# Patient Record
Sex: Male | Born: 1965 | Race: White | Hispanic: No | Marital: Married | Smoking: Never smoker
Health system: Southern US, Community
[De-identification: ages and names within clinical notes are randomized; demographics above are authoritative.]

## PROBLEM LIST (undated history)

## (undated) DIAGNOSIS — I10 Essential (primary) hypertension: Secondary | ICD-10-CM

## (undated) DIAGNOSIS — J45909 Unspecified asthma, uncomplicated: Secondary | ICD-10-CM

## (undated) HISTORY — PX: KNEE SURGERY: SHX244

## (undated) HISTORY — PX: TONSILLECTOMY: SUR1361

---

## 2012-05-17 ENCOUNTER — Emergency Department (HOSPITAL_COMMUNITY): Payer: Worker's Compensation

## 2012-05-17 ENCOUNTER — Emergency Department (HOSPITAL_COMMUNITY)
Admission: EM | Admit: 2012-05-17 | Discharge: 2012-05-17 | Disposition: A | Discharge status: Home / Self Care | Payer: Worker's Compensation | Attending: Physician Assistant | Admitting: Physician Assistant

## 2012-05-17 ENCOUNTER — Encounter (HOSPITAL_COMMUNITY): Payer: Self-pay | Admitting: *Deleted

## 2012-05-17 DIAGNOSIS — M542 Cervicalgia: Secondary | ICD-10-CM | POA: Insufficient documentation

## 2012-05-17 DIAGNOSIS — S139XXA Sprain of joints and ligaments of unspecified parts of neck, initial encounter: Secondary | ICD-10-CM | POA: Insufficient documentation

## 2012-05-17 DIAGNOSIS — R51 Headache: Secondary | ICD-10-CM | POA: Insufficient documentation

## 2012-05-17 DIAGNOSIS — S161XXA Strain of muscle, fascia and tendon at neck level, initial encounter: Secondary | ICD-10-CM

## 2012-05-17 DIAGNOSIS — S0083XA Contusion of other part of head, initial encounter: Secondary | ICD-10-CM

## 2012-05-17 DIAGNOSIS — I10 Essential (primary) hypertension: Secondary | ICD-10-CM | POA: Insufficient documentation

## 2012-05-17 DIAGNOSIS — J45909 Unspecified asthma, uncomplicated: Secondary | ICD-10-CM | POA: Insufficient documentation

## 2012-05-17 DIAGNOSIS — S1093XA Contusion of unspecified part of neck, initial encounter: Secondary | ICD-10-CM | POA: Insufficient documentation

## 2012-05-17 DIAGNOSIS — S0003XA Contusion of scalp, initial encounter: Secondary | ICD-10-CM | POA: Insufficient documentation

## 2012-05-17 HISTORY — DX: Unspecified asthma, uncomplicated: J45.909

## 2012-05-17 HISTORY — DX: Essential (primary) hypertension: I10

## 2012-05-17 NOTE — ED Notes (Signed)
H. Bryant, PA at bedside. 
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
## 2012-05-17 NOTE — Discharge Instructions (Signed)
Your CT scan of the facial bones is negative for fracture or dislocation. Your x-ray of the neck (cervical spine) is negative for fracture or dislocation. Please apply ice pack to the left face to improve swelling. Use Tylenol, or Motrin for soreness. Please see a Doctor, hospital, or return to the emergency department if any changes, problems, or concerns.Cervical Sprain A cervical sprain is an injury in the neck in which the ligaments are stretched or torn. The ligaments are the tissues that hold the bones of the neck (vertebrae) in place.Cervical sprains can range from very mild to very severe. Most cervical sprains get better in 1 to 3 weeks, but it depends on the cause and extent of the injury. Severe cervical sprains can cause the neck vertebrae to be unstable. This can lead to damage of the spinal cord and can result in serious nervous system problems. Your caregiver will determine whether your cervical sprain is mild or severe. CAUSES  Severe cervical sprains may be caused by:  Contact sport injuries (football, rugby, wrestling, hockey, auto racing, gymnastics, diving, martial arts, boxing).   Motor vehicle collisions.   Whiplash injuries. This means the neck is forcefully whipped backward and forward.   Falls.  Mild cervical sprains may be caused by:   Awkward positions, such as cradling a telephone between your ear and shoulder.   Sitting in a chair that does not offer proper support.   Working at a poorly Marketing executive station.   Activities that require looking up or down for long periods of time.  SYMPTOMS   Pain, soreness, stiffness, or a burning sensation in the front, back, or sides of the neck. This discomfort may develop immediately after injury or it may develop slowly and not begin for 24 hours or more after an injury.   Pain or tenderness directly in the middle of the back of the neck.   Shoulder or upper back pain.   Limited ability to move the  neck.   Headache.   Dizziness.   Weakness, numbness, or tingling in the hands or arms.   Muscle spasms.   Difficulty swallowing or chewing.   Tenderness and swelling of the neck.  DIAGNOSIS  Most of the time, your caregiver can diagnose this problem by taking your history and doing a physical exam. Your caregiver will ask about any known problems, such as arthritis in the neck or a previous neck injury. X-rays may be taken to find out if there are any other problems, such as problems with the bones of the neck. However, an X-ray often does not reveal the full extent of a cervical sprain. Other tests such as a computed tomography (CT) scan or magnetic resonance imaging (MRI) may be needed. TREATMENT  Treatment depends on the severity of the cervical sprain. Mild sprains can be treated with rest, keeping the neck in place (immobilization), and pain medicines. Severe cervical sprains need immediate immobilization and an appointment with an orthopedist or neurosurgeon. Several treatment options are available to help with pain, muscle spasms, and other symptoms. Your caregiver may prescribe:  Medicines, such as pain relievers, numbing medicines, or muscle relaxants.   Physical therapy. This can include stretching exercises, strengthening exercises, and posture training. Exercises and improved posture can help stabilize the neck, strengthen muscles, and help stop symptoms from returning.   A neck collar to be worn for short periods of time. Often, these collars are worn for comfort. However, certain collars may be worn to protect the  neck and prevent further worsening of a serious cervical sprain.  HOME CARE INSTRUCTIONS   Put ice on the injured area.   Put ice in a plastic bag.   Place a towel between your skin and the bag.   Leave the ice on for 15 to 20 minutes, 3 to 4 times a day.   Only take over-the-counter or prescription medicines for pain, discomfort, or fever as directed by your  caregiver.   Keep all follow-up appointments as directed by your caregiver.   Keep all physical therapy appointments as directed by your caregiver.   If a neck collar is prescribed, wear it as directed by your caregiver.   Do not drive while wearing a neck collar.   Make any needed adjustments to your work station to promote good posture.   Avoid positions and activities that make your symptoms worse.   Warm up and stretch before being active to help prevent problems.  SEEK MEDICAL CARE IF:   Your pain is not controlled with medicine.   You are unable to decrease your pain medicine over time as planned.   Your activity level is not improving as expected.  SEEK IMMEDIATE MEDICAL CARE IF:   You develop any bleeding, stomach upset, or signs of an allergic reaction to your medicine.   Your symptoms get worse.   You develop new, unexplained symptoms.   You have numbness, tingling, weakness, or paralysis in any part of your body.  MAKE SURE YOU:   Understand these instructions.   Will watch your condition.   Will get help right away if you are not doing well or get worse.  Document Released: 09/09/2007 Document Revised: 11/01/2011 Document Reviewed: 08/15/2011 [facility] Patient Information 2012 [place], [place].Contusion A contusion is a deep bruise. Contusions happen when an injury causes bleeding under the skin. Signs of bruising include pain, puffiness (swelling), and discolored skin. The contusion may turn blue, purple, or yellow. HOME CARE   Put ice on the injured area.   Put ice in a plastic bag.   Place a towel between your skin and the bag.   Leave the ice on for 15 to 20 minutes, 3 to 4 times a day.   Only take medicine as told by your doctor.   Rest the injured area.   If possible, raise (elevate) the injured area to lessen puffiness.  GET HELP RIGHT AWAY IF:   You have more bruising or puffiness.   You have pain that is getting worse.   Your puffiness  or pain is not helped by medicine.  MAKE SURE YOU:   Understand these instructions.   Will watch your condition.   Will get help right away if you are not doing well or get worse.  Document Released: 04/30/2008 Document Revised: 11/01/2011 Document Reviewed: 09/17/2011 [facility] Patient Information 2012 [place], [place].
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
## 2012-06-18 NOTE — ED Provider Notes (Signed)
Medical screening examination/treatment/procedure(s) were performed by non-physician practitioner and as supervising physician I was immediately available for consultation/collaboration.   Joya Gaskins, MD 06/18/12 2033

## 2014-11-18 ENCOUNTER — Emergency Department (HOSPITAL_COMMUNITY)
Admission: EM | Admit: 2014-11-18 | Discharge: 2014-11-18 | Disposition: A | Discharge status: Home / Self Care | Payer: BC Managed Care – PPO | Attending: Emergency Medicine | Admitting: Emergency Medicine

## 2014-11-18 ENCOUNTER — Encounter (HOSPITAL_COMMUNITY): Payer: Self-pay | Admitting: Emergency Medicine

## 2014-11-18 DIAGNOSIS — I10 Essential (primary) hypertension: Secondary | ICD-10-CM | POA: Insufficient documentation

## 2014-11-18 DIAGNOSIS — J45909 Unspecified asthma, uncomplicated: Secondary | ICD-10-CM | POA: Insufficient documentation

## 2014-11-18 DIAGNOSIS — L723 Sebaceous cyst: Secondary | ICD-10-CM | POA: Diagnosis not present

## 2014-11-18 DIAGNOSIS — L089 Local infection of the skin and subcutaneous tissue, unspecified: Secondary | ICD-10-CM | POA: Diagnosis present

## 2014-11-18 MED ORDER — CEPHALEXIN 500 MG PO CAPS: 500.0000 mg | ORAL_CAPSULE | Freq: Four times a day (QID) | ORAL | Status: DC

## 2014-11-18 MED ORDER — CEPHALEXIN 500 MG PO CAPS
1000.0000 mg | ORAL_CAPSULE | Freq: Once | ORAL | Status: AC
  Administered 2014-11-18: 1000 mg via ORAL
  Filled 2014-11-18: qty 2

## 2014-11-18 MED ORDER — LIDOCAINE HCL (PF) 1 % IJ SOLN
10.0000 mL | Freq: Once | INTRAMUSCULAR | Status: AC
  Administered 2014-11-18: 10 mL
  Filled 2014-11-18: qty 10

## 2014-11-18 NOTE — ED Notes (Signed)
Boil to back lanced by EDP, covered with 4x4 and nonadherent dressing. Secured with Tegaderm.

## 2014-11-18 NOTE — ED Notes (Signed)
Discharge instructions given, pt demonstrated teach back and verbal understanding. No concerns voiced.  

## 2014-11-18 NOTE — ED Notes (Signed)
Recurrent boil on back that has been a "knot" and has over the last couple of days become inflamed and oozing per the patient and the wife.

## 2014-11-18 NOTE — ED Provider Notes (Signed)
CSN: [id number]     Arrival date & time 11/18/14  0108 History   First MD Initiated Contact with Patient 11/18/14 0405     Chief Complaint  Patient presents with  . Recurrent Skin Infections     (Consider location/radiation/quality/duration/timing/severity/associated sxs/prior Treatment) The history is provided by the patient.   48 year old male comes in with a swollen area in his back. The areas been swollen for years but started getting inflamed about 2 weeks ago. His wife was able to express some sebaceous material at home but she is concerned that she did not All of the material out. He states the pain is improved following her expressing the material. He denies fever, chills, sweats.  Past Medical History  Diagnosis Date  . Hypertension   . Asthma    Past Surgical History  Procedure Laterality Date  . Knee surgery    . Tonsillectomy     History reviewed. No pertinent family history. History  Substance Use Topics  . Smoking status: Never Smoker   . Smokeless tobacco: Not on file  . Alcohol Use: No    Review of Systems  All other systems reviewed and are negative.     Allergies  Avelox and Zyrtec  Home Medications   Prior to Admission medications   Not on File   BP 122/80 mmHg  Temp(Src) 99.2 F (37.3 C) (Oral)  Resp 14  Ht 6' (1.829 m)  Wt 260 lb (117.935 kg)  BMI 35.25 kg/m2  SpO2 100% Physical Exam  Nursing note and vitals reviewed.  48 year old male, resting comfortably and in no acute distress. Vital signs are normal. Oxygen saturation is 100%, which is normal. Head is normocephalic and atraumatic. PERRLA, EOMI. Oropharynx is clear. Neck is nontender and supple without adenopathy or JVD. Back: There is a large sebaceous cyst on the right side of the upper back with moderate erythema. There is no spontaneous drainage.. Lungs are clear without rales, wheezes, or rhonchi. Chest is nontender. Heart has regular rate and rhythm without murmur. Abdomen  is soft, flat, nontender without masses or hepatosplenomegaly and peristalsis is normoactive. Extremities have no cyanosis or edema, full range of motion is present. Skin is warm and dry without rash. Neurologic: Mental status is normal, cranial nerves are intact, there are no motor or sensory deficits.  ED Course  Procedures (including critical care time) INCISION AND DRAINAGE Performed by: VHQIO,NGEXBGLICK,Jayse Hodkinson Consent: Verbal consent obtained. Risks and benefits: risks, benefits and alternatives were discussed Type: abscess  Body area: Back  Anesthesia: local infiltration  Incision was made with a scalpel.  Local anesthetic: lidocaine 1% without epinephrine  Anesthetic total: 3  ml  Complexity: complex Blunt dissection to break up loculations  Drainage: Sebaceous material with blood   Drainage amount: Moderate   Packing material: None   Patient tolerance: Patient tolerated the procedure well with no immediate complications.     MDM   Final diagnoses:  Inflamed sebaceous cyst    Inflamed/infected sebaceous cyst. Incision and drainage did not completely remove all the material. I suspect that some of the swelling is chronic inflammation. He is discharged with prescription for cephalexin and is referred to general surgery for consideration for cyst excision.    Dione Boozeavid Jahlon Baines, MD 11/18/14 [phone]

## 2014-11-18 NOTE — Discharge Instructions (Signed)
You have an infected cyst on your back. I suspect this will actually need surgical removal. Please make an appointment with a surgeon for follow-up. In the meantime, take the antibiotic as prescribed. Take acetaminophen or ibuprofen as needed for pain. Return if symptoms worsen.  Cephalexin tablets or capsules What is this medicine? CEPHALEXIN (sef a LEX in) is a cephalosporin antibiotic. It is used to treat certain kinds of bacterial infections It will not work for colds, flu, or other viral infections. This medicine may be used for other purposes; ask your health care provider or pharmacist if you have questions. COMMON BRAND NAME(S): Biocef, Keflex, Keftab What should I tell my health care provider before I take this medicine? They need to know if you have any of these conditions: -kidney disease -stomach or intestine problems, especially colitis -an unusual or allergic reaction to cephalexin, other cephalosporins, penicillins, other antibiotics, medicines, foods, dyes or preservatives -pregnant or trying to get pregnant -breast-feeding How should I use this medicine? Take this medicine by mouth with a full glass of water. Follow the directions on the prescription label. This medicine can be taken with or without food. Take your medicine at regular intervals. Do not take your medicine more often than directed. Take all of your medicine as directed even if you think you are better. Do not skip doses or stop your medicine early. Talk to your pediatrician regarding the use of this medicine in children. While this drug may be prescribed for selected conditions, precautions do apply. Overdosage: If you think you have taken too much of this medicine contact a poison control center or emergency room at once. NOTE: This medicine is only for you. Do not share this medicine with others. What if I miss a dose? If you miss a dose, take it as soon as you can. If it is almost time for your next dose, take  only that dose. Do not take double or extra doses. There should be at least 4 to 6 hours between doses. What may interact with this medicine? -probenecid -some other antibiotics This list may not describe all possible interactions. Give your health care provider a list of all the medicines, herbs, non-prescription drugs, or dietary supplements you use. Also tell them if you smoke, drink alcohol, or use illegal drugs. Some items may interact with your medicine. What should I watch for while using this medicine? Tell your doctor or health care professional if your symptoms do not begin to improve in a few days. Do not treat diarrhea with over the counter products. Contact your doctor if you have diarrhea that lasts more than 2 days or if it is severe and watery. If you have diabetes, you may get a false-positive result for sugar in your urine. Check with your doctor or health care professional. What side effects may I notice from receiving this medicine? Side effects that you should report to your doctor or health care professional as soon as possible: -allergic reactions like skin rash, itching or hives, swelling of the face, lips, or tongue -breathing problems -pain or trouble passing urine -redness, blistering, peeling or loosening of the skin, including inside the mouth -severe or watery diarrhea -unusually weak or tired -yellowing of the eyes, skin Side effects that usually do not require medical attention (report to your doctor or health care professional if they continue or are bothersome): -gas or heartburn -genital or anal irritation -headache -joint or muscle pain -nausea, vomiting This list may not describe all possible  side effects. Call your doctor for medical advice about side effects. You may report side effects to FDA at 1-800-FDA-1088. Where should I keep my medicine? Keep out of the reach of children. Store at room temperature between 59 and 86 degrees F (15 and 30 degrees  C). Throw away any unused medicine after the expiration date. NOTE: This sheet is a summary. It may not cover all possible information. If you have questions about this medicine, talk to your doctor, pharmacist, or health care provider.  2015, [employer]vier/[employer]. (2008-02-16 17:09:13)

## 2015-03-22 ENCOUNTER — Encounter (HOSPITAL_COMMUNITY): Payer: Self-pay | Admitting: Emergency Medicine

## 2015-03-22 ENCOUNTER — Emergency Department (HOSPITAL_COMMUNITY): Payer: BC Managed Care – PPO

## 2015-03-22 ENCOUNTER — Emergency Department (HOSPITAL_COMMUNITY)
Admission: EM | Admit: 2015-03-22 | Discharge: 2015-03-22 | Disposition: A | Discharge status: Home / Self Care | Payer: BC Managed Care – PPO | Attending: Emergency Medicine | Admitting: Emergency Medicine

## 2015-03-22 DIAGNOSIS — Y9389 Activity, other specified: Secondary | ICD-10-CM | POA: Diagnosis not present

## 2015-03-22 DIAGNOSIS — Y9289 Other specified places as the place of occurrence of the external cause: Secondary | ICD-10-CM | POA: Diagnosis not present

## 2015-03-22 DIAGNOSIS — Y990 Civilian activity done for income or pay: Secondary | ICD-10-CM | POA: Diagnosis not present

## 2015-03-22 DIAGNOSIS — S6991XA Unspecified injury of right wrist, hand and finger(s), initial encounter: Secondary | ICD-10-CM | POA: Diagnosis present

## 2015-03-22 DIAGNOSIS — X58XXXA Exposure to other specified factors, initial encounter: Secondary | ICD-10-CM | POA: Insufficient documentation

## 2015-03-22 DIAGNOSIS — S63501A Unspecified sprain of right wrist, initial encounter: Secondary | ICD-10-CM | POA: Diagnosis not present

## 2015-03-22 DIAGNOSIS — J45909 Unspecified asthma, uncomplicated: Secondary | ICD-10-CM | POA: Diagnosis not present

## 2015-03-22 DIAGNOSIS — I10 Essential (primary) hypertension: Secondary | ICD-10-CM | POA: Diagnosis not present
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
## 2015-03-22 NOTE — Discharge Instructions (Signed)
Take advil regularly for the next few days. Follow up with Dr. Romeo AppleHarrison, return here as needed.
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
## 2023-05-11 ENCOUNTER — Other Ambulatory Visit: Payer: Self-pay

## 2023-05-11 ENCOUNTER — Emergency Department (HOSPITAL_COMMUNITY)
Admission: EM | Admit: 2023-05-11 | Discharge: 2023-05-12 | Disposition: A | Discharge status: Home / Self Care | Payer: No Typology Code available for payment source | Attending: Emergency Medicine | Admitting: Emergency Medicine

## 2023-05-11 ENCOUNTER — Encounter (HOSPITAL_COMMUNITY): Payer: Self-pay | Admitting: Emergency Medicine

## 2023-05-11 DIAGNOSIS — S51851A Open bite of right forearm, initial encounter: Secondary | ICD-10-CM | POA: Insufficient documentation

## 2023-05-11 DIAGNOSIS — Y990 Civilian activity done for income or pay: Secondary | ICD-10-CM | POA: Diagnosis not present

## 2023-05-11 DIAGNOSIS — R030 Elevated blood-pressure reading, without diagnosis of hypertension: Secondary | ICD-10-CM | POA: Insufficient documentation

## 2023-05-11 DIAGNOSIS — S59911A Unspecified injury of right forearm, initial encounter: Secondary | ICD-10-CM | POA: Diagnosis present

## 2023-05-11 DIAGNOSIS — W503XXA Accidental bite by another person, initial encounter: Secondary | ICD-10-CM

## 2023-05-11 MED ORDER — AMOXICILLIN-POT CLAVULANATE 875-125 MG PO TABS
1.0000 | ORAL_TABLET | Freq: Once | ORAL | Status: AC
  Administered 2023-05-12: 1 via ORAL
  Filled 2023-05-11: qty 1
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
## 2023-05-12 MED ORDER — AMOXICILLIN-POT CLAVULANATE 875-125 MG PO TABS: 1.0000 | ORAL_TABLET | Freq: Two times a day (BID) | ORAL | 0 refills | Status: AC
